# Patient Record
Sex: Male | Born: 1950 | Race: Black or African American | Hispanic: No | Marital: Married
Health system: Southern US, Community
[De-identification: ages and names within clinical notes are randomized; demographics above are authoritative.]

---

## 2001-04-29 ENCOUNTER — Encounter: Payer: Self-pay | Admitting: *Deleted

## 2001-04-29 ENCOUNTER — Emergency Department (HOSPITAL_COMMUNITY): Admission: EM | Admit: 2001-04-29 | Discharge: 2001-04-29 | Payer: Self-pay | Admitting: *Deleted

## 2004-01-15 ENCOUNTER — Emergency Department (HOSPITAL_COMMUNITY): Admission: EM | Admit: 2004-01-15 | Discharge: 2004-01-15 | Payer: Self-pay | Admitting: Emergency Medicine

## 2004-01-29 ENCOUNTER — Emergency Department (HOSPITAL_COMMUNITY): Admission: EM | Admit: 2004-01-29 | Discharge: 2004-01-29 | Payer: Self-pay | Admitting: Emergency Medicine

## 2004-01-30 ENCOUNTER — Ambulatory Visit (HOSPITAL_COMMUNITY): Admission: RE | Admit: 2004-01-30 | Discharge: 2004-01-30 | Payer: Self-pay | Admitting: Emergency Medicine

## 2004-02-23 ENCOUNTER — Ambulatory Visit (HOSPITAL_COMMUNITY): Admission: RE | Admit: 2004-02-23 | Discharge: 2004-02-23 | Payer: Self-pay | Admitting: Surgery

## 2004-02-27 ENCOUNTER — Inpatient Hospital Stay (HOSPITAL_COMMUNITY): Admission: RE | Admit: 2004-02-27 | Discharge: 2004-03-01 | Payer: Self-pay | Admitting: Surgery

## 2004-02-27 ENCOUNTER — Encounter: Payer: Self-pay | Admitting: Surgery

## 2004-03-09 ENCOUNTER — Inpatient Hospital Stay (HOSPITAL_COMMUNITY): Admission: EM | Admit: 2004-03-09 | Discharge: 2004-03-11 | Payer: Self-pay | Admitting: Emergency Medicine

## 2004-03-14 ENCOUNTER — Inpatient Hospital Stay (HOSPITAL_COMMUNITY): Admission: EM | Admit: 2004-03-14 | Discharge: 2004-03-29 | Payer: Self-pay | Admitting: Emergency Medicine

## 2004-03-14 ENCOUNTER — Ambulatory Visit: Payer: Self-pay | Admitting: Surgery

## 2004-04-04 ENCOUNTER — Ambulatory Visit: Payer: Self-pay | Admitting: Internal Medicine

## 2004-05-23 ENCOUNTER — Ambulatory Visit: Payer: Self-pay | Admitting: Cardiology

## 2004-06-14 ENCOUNTER — Ambulatory Visit (HOSPITAL_COMMUNITY): Admission: RE | Admit: 2004-06-14 | Discharge: 2004-06-14 | Payer: Self-pay | Admitting: Cardiology

## 2004-06-14 ENCOUNTER — Ambulatory Visit: Payer: Self-pay | Admitting: *Deleted

## 2004-06-24 ENCOUNTER — Ambulatory Visit: Payer: Self-pay | Admitting: Cardiology

## 2004-07-17 ENCOUNTER — Ambulatory Visit (HOSPITAL_COMMUNITY): Admission: RE | Admit: 2004-07-17 | Discharge: 2004-07-17 | Payer: Self-pay | Admitting: General Surgery

## 2004-07-24 ENCOUNTER — Encounter: Admission: RE | Admit: 2004-07-24 | Discharge: 2004-10-22 | Payer: Self-pay | Admitting: Surgery

## 2004-12-05 IMAGING — CR DG CHEST 1V PORT
1 series · 1 of 1 positions shown · non-contrast
Comparison: none

CLINICAL DATA: Short of breath.  Left foot gangrene.
 PORTABLE CHEST, 03/10/04, [DATE] HOURS
 Compared to a portable chest of 02/28/04,there are opacities throughout the lungs primarily perihilar with some peribronchial thickening and interstitial prominence.  These findings are most consistent with develop of edema.  Mild cardiomegaly is present.  No effusion is seen.
 IMPRESSION 
 Probable edema.  Suggest follow-up.

[view not recorded]
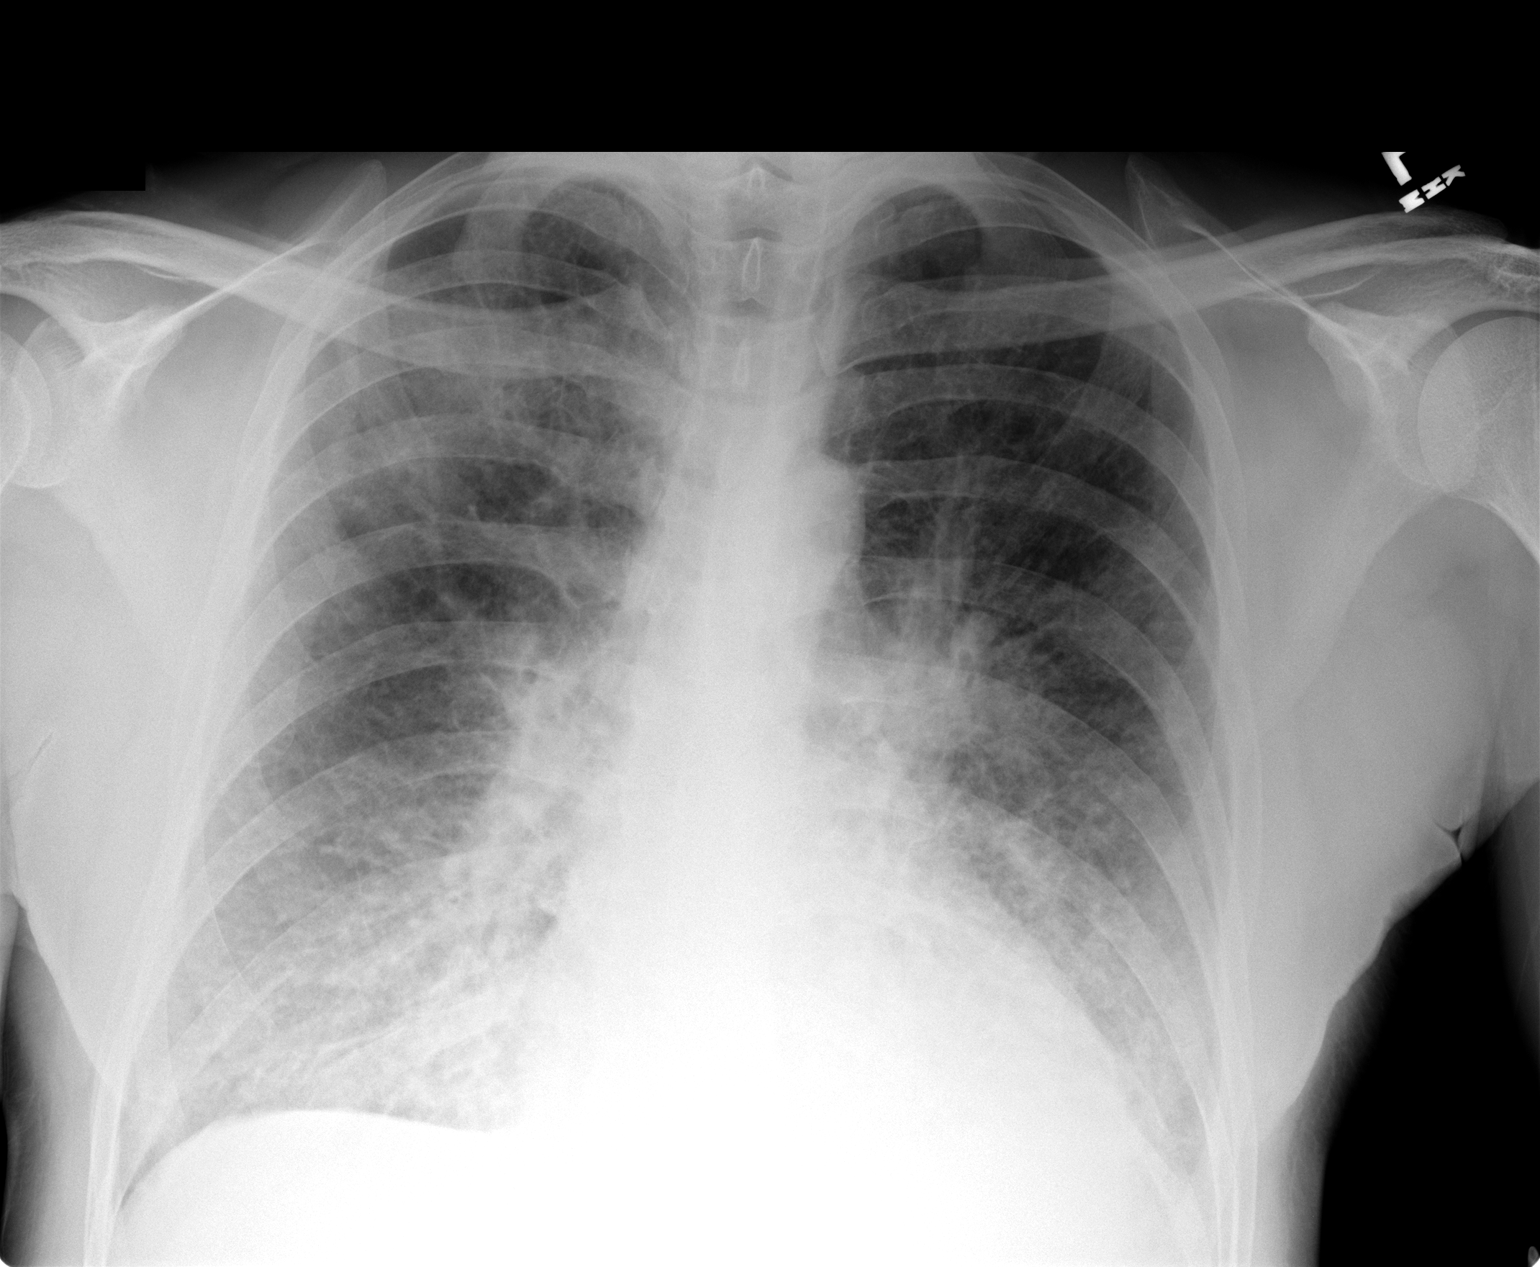

[1 of 1 positions shown; findings below may reference images not displayed]

## 2004-12-12 IMAGING — CR DG CHEST 2V
2 series · 2 of 2 positions shown · non-contrast
Comparison: 03/10/04
There has been interval decrease in diffuse pulmonary edema.

CLINICAL DATA: Wound infection.  Dyspnea. 
TWO VIEW CHEST ? 03/17/04:

[view not recorded (1 of 2)]
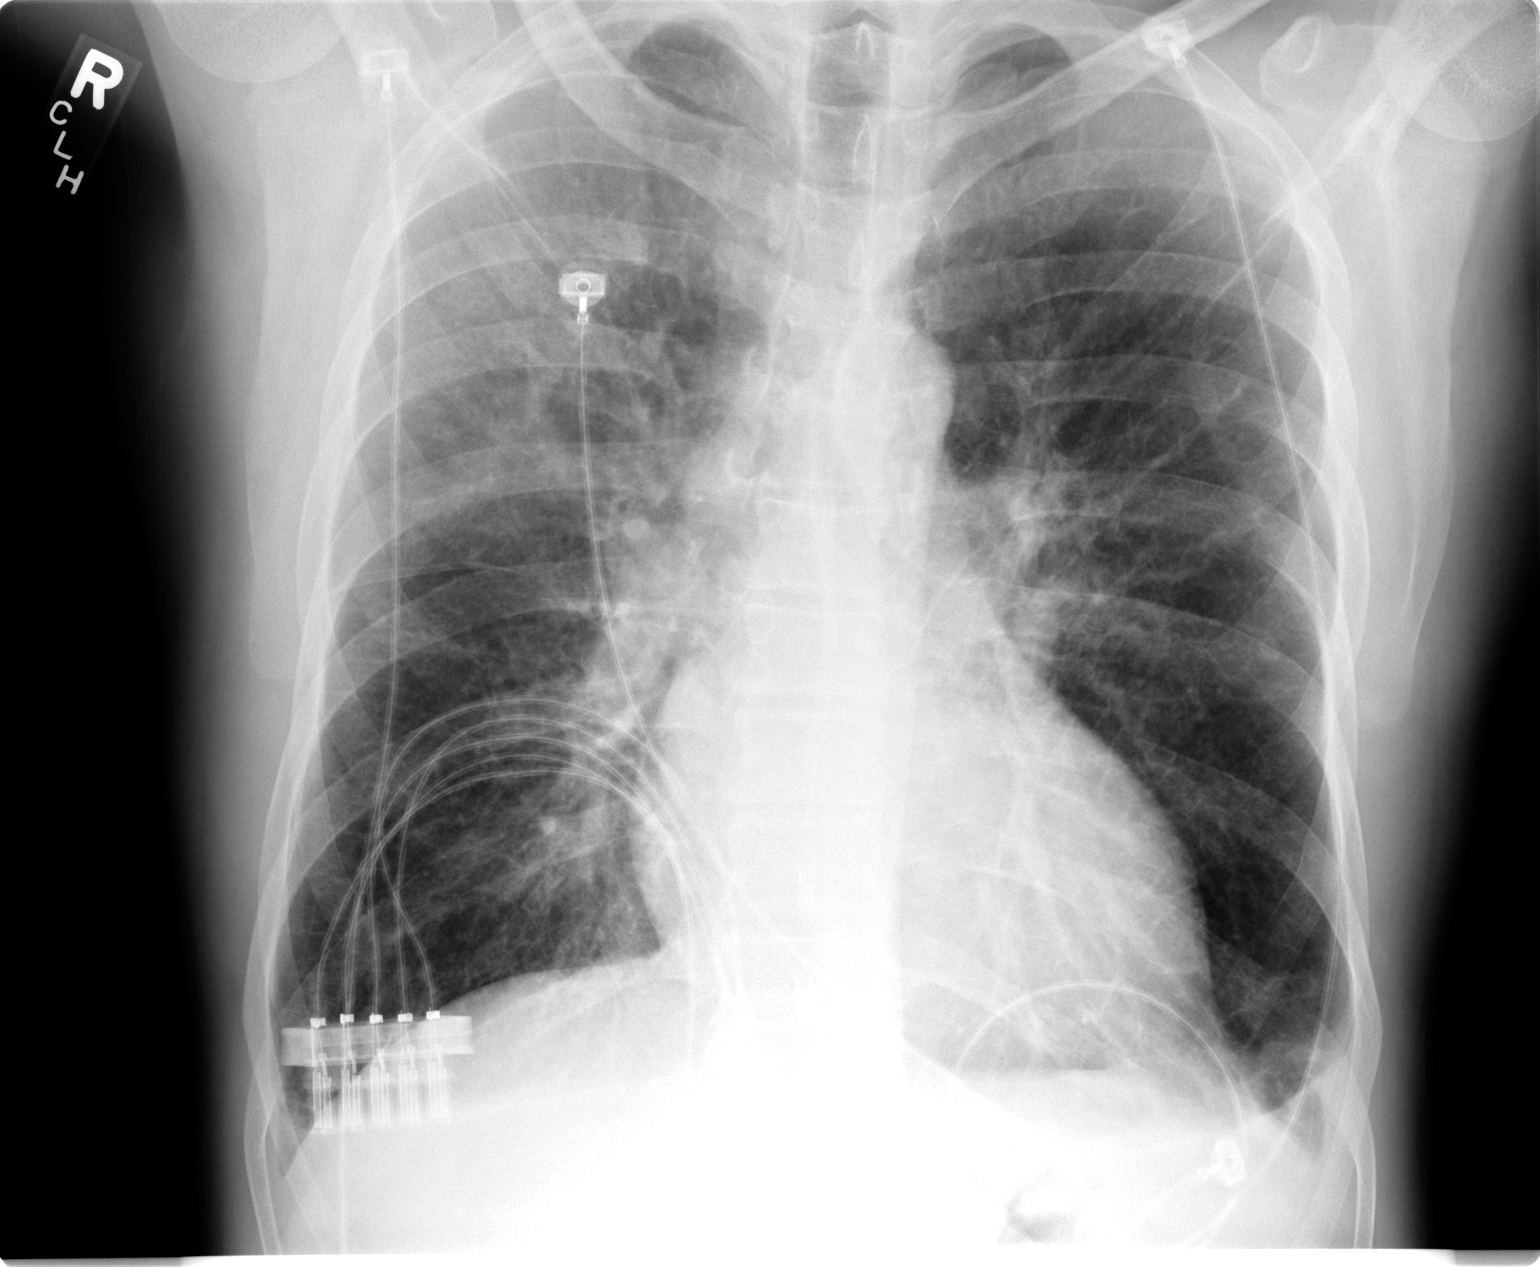

[view not recorded (2 of 2)]
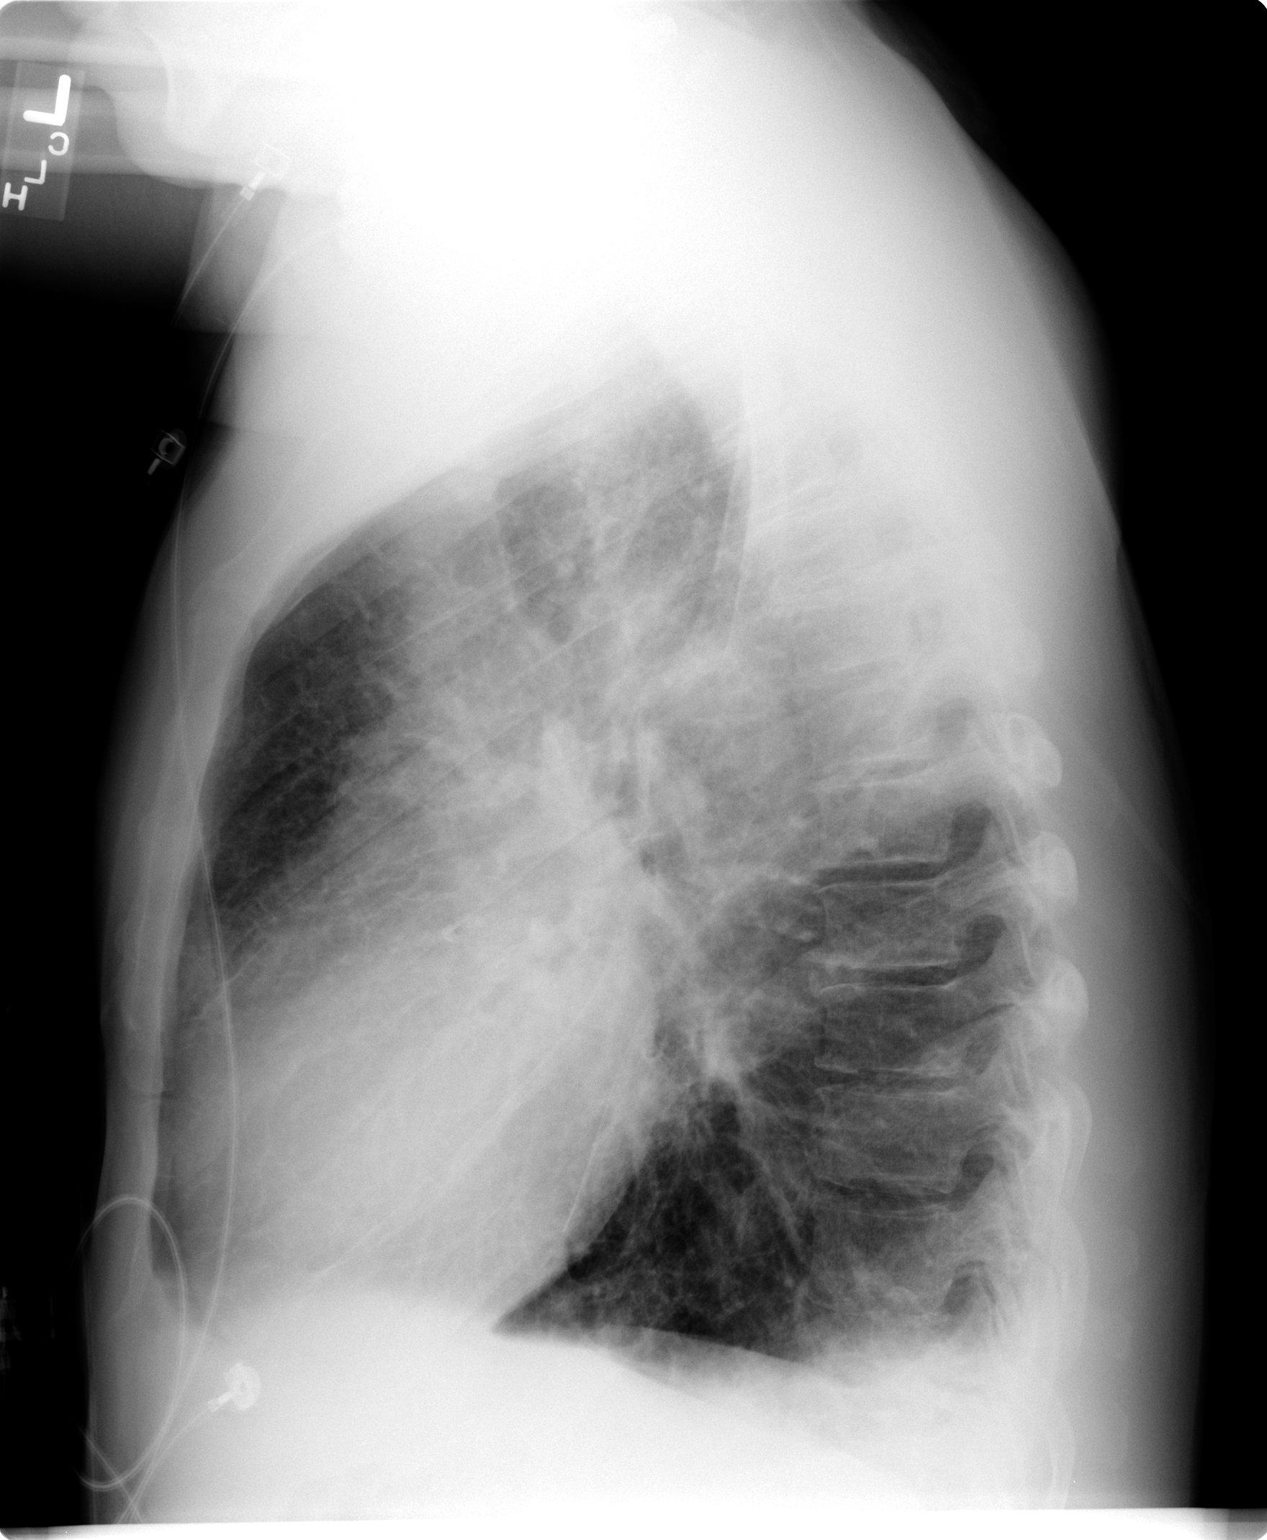

[2 of 2 positions shown; findings below may reference images not displayed]

FINDINGS: 
Focal air space disease within the right upper lung and posterior lung bases with small effusions noted.    Cardiomegaly is unchanged.
IMPRESSION: Significant decreased pulmonary edema.  
Continued air space disease within the right upper lobe and atelectasis/air space disease in the lung bases. Pneumonia not excluded.   Small bilateral pleural effusions.

## 2005-03-02 ENCOUNTER — Emergency Department (HOSPITAL_COMMUNITY): Admission: EM | Admit: 2005-03-02 | Discharge: 2005-03-02 | Payer: Self-pay | Admitting: *Deleted

## 2009-01-16 DIAGNOSIS — Z8679 Personal history of other diseases of the circulatory system: Secondary | ICD-10-CM | POA: Insufficient documentation

## 2009-01-16 DIAGNOSIS — I739 Peripheral vascular disease, unspecified: Secondary | ICD-10-CM | POA: Insufficient documentation

## 2009-01-16 DIAGNOSIS — E785 Hyperlipidemia, unspecified: Secondary | ICD-10-CM | POA: Insufficient documentation

## 2013-03-31 ENCOUNTER — Telehealth: Payer: Self-pay | Admitting: Physician Assistant

## 2013-11-21 NOTE — Telephone Encounter (Signed)
Chart opened in error
